# Patient Record
Sex: Male | Born: 1950
Health system: Southern US, Community
[De-identification: ages and names within clinical notes are randomized; demographics above are authoritative.]

## PROBLEM LIST (undated history)

## (undated) DIAGNOSIS — E7800 Pure hypercholesterolemia, unspecified: Secondary | ICD-10-CM

## (undated) DIAGNOSIS — M109 Gout, unspecified: Secondary | ICD-10-CM

---

## 2000-11-17 ENCOUNTER — Ambulatory Visit (HOSPITAL_COMMUNITY): Admission: RE | Admit: 2000-11-17 | Discharge: 2000-11-17 | Payer: Self-pay | Admitting: *Deleted

## 2000-11-17 ENCOUNTER — Encounter: Payer: Self-pay | Admitting: *Deleted

## 2002-12-24 ENCOUNTER — Encounter: Payer: Self-pay | Admitting: Family Medicine

## 2002-12-24 ENCOUNTER — Ambulatory Visit (HOSPITAL_COMMUNITY): Admission: RE | Admit: 2002-12-24 | Discharge: 2002-12-24 | Payer: Self-pay | Admitting: Family Medicine

## 2003-05-03 ENCOUNTER — Emergency Department (HOSPITAL_COMMUNITY): Admission: EM | Admit: 2003-05-03 | Discharge: 2003-05-03 | Payer: Self-pay | Admitting: Emergency Medicine

## 2003-09-09 ENCOUNTER — Ambulatory Visit (HOSPITAL_COMMUNITY): Admission: RE | Admit: 2003-09-09 | Discharge: 2003-09-09 | Payer: Self-pay | Admitting: Family Medicine

## 2003-09-16 ENCOUNTER — Ambulatory Visit (HOSPITAL_COMMUNITY): Admission: RE | Admit: 2003-09-16 | Discharge: 2003-09-16 | Payer: Self-pay | Admitting: Family Medicine

## 2004-02-07 ENCOUNTER — Ambulatory Visit (HOSPITAL_COMMUNITY): Admission: RE | Admit: 2004-02-07 | Discharge: 2004-02-07 | Payer: Self-pay | Admitting: Orthopaedic Surgery

## 2016-05-30 DIAGNOSIS — Z6826 Body mass index (BMI) 26.0-26.9, adult: Secondary | ICD-10-CM | POA: Diagnosis not present

## 2016-05-30 DIAGNOSIS — E063 Autoimmune thyroiditis: Secondary | ICD-10-CM | POA: Diagnosis not present

## 2016-05-30 DIAGNOSIS — E663 Overweight: Secondary | ICD-10-CM | POA: Diagnosis not present

## 2016-05-30 DIAGNOSIS — E782 Mixed hyperlipidemia: Secondary | ICD-10-CM | POA: Diagnosis not present

## 2016-05-30 DIAGNOSIS — Z1389 Encounter for screening for other disorder: Secondary | ICD-10-CM | POA: Diagnosis not present

## 2016-07-03 DIAGNOSIS — E782 Mixed hyperlipidemia: Secondary | ICD-10-CM | POA: Diagnosis not present

## 2016-07-03 DIAGNOSIS — Z1389 Encounter for screening for other disorder: Secondary | ICD-10-CM | POA: Diagnosis not present

## 2016-07-03 DIAGNOSIS — E063 Autoimmune thyroiditis: Secondary | ICD-10-CM | POA: Diagnosis not present

## 2016-07-03 DIAGNOSIS — Z6826 Body mass index (BMI) 26.0-26.9, adult: Secondary | ICD-10-CM | POA: Diagnosis not present

## 2017-01-03 DIAGNOSIS — E663 Overweight: Secondary | ICD-10-CM | POA: Diagnosis not present

## 2017-01-03 DIAGNOSIS — Z1389 Encounter for screening for other disorder: Secondary | ICD-10-CM | POA: Diagnosis not present

## 2017-01-03 DIAGNOSIS — E782 Mixed hyperlipidemia: Secondary | ICD-10-CM | POA: Diagnosis not present

## 2017-01-03 DIAGNOSIS — E063 Autoimmune thyroiditis: Secondary | ICD-10-CM | POA: Diagnosis not present

## 2017-01-03 DIAGNOSIS — Z6825 Body mass index (BMI) 25.0-25.9, adult: Secondary | ICD-10-CM | POA: Diagnosis not present

## 2017-03-05 DIAGNOSIS — E039 Hypothyroidism, unspecified: Secondary | ICD-10-CM | POA: Diagnosis not present

## 2017-03-05 DIAGNOSIS — R7309 Other abnormal glucose: Secondary | ICD-10-CM | POA: Diagnosis not present

## 2017-03-05 DIAGNOSIS — Z6827 Body mass index (BMI) 27.0-27.9, adult: Secondary | ICD-10-CM | POA: Diagnosis not present

## 2017-03-05 DIAGNOSIS — Z1389 Encounter for screening for other disorder: Secondary | ICD-10-CM | POA: Diagnosis not present

## 2017-10-16 DIAGNOSIS — Z6824 Body mass index (BMI) 24.0-24.9, adult: Secondary | ICD-10-CM | POA: Diagnosis not present

## 2017-10-16 DIAGNOSIS — M1000 Idiopathic gout, unspecified site: Secondary | ICD-10-CM | POA: Diagnosis not present

## 2018-03-20 DIAGNOSIS — E039 Hypothyroidism, unspecified: Secondary | ICD-10-CM | POA: Diagnosis not present

## 2018-03-20 DIAGNOSIS — Z6825 Body mass index (BMI) 25.0-25.9, adult: Secondary | ICD-10-CM | POA: Diagnosis not present

## 2018-03-20 DIAGNOSIS — E663 Overweight: Secondary | ICD-10-CM | POA: Diagnosis not present

## 2018-03-20 DIAGNOSIS — E063 Autoimmune thyroiditis: Secondary | ICD-10-CM | POA: Diagnosis not present

## 2018-03-20 DIAGNOSIS — E782 Mixed hyperlipidemia: Secondary | ICD-10-CM | POA: Diagnosis not present

## 2018-09-05 ENCOUNTER — Emergency Department (HOSPITAL_COMMUNITY)
Admission: EM | Admit: 2018-09-05 | Discharge: 2018-09-05 | Disposition: A | Discharge status: Home / Self Care | Payer: BLUE CROSS/BLUE SHIELD | Attending: Emergency Medicine | Admitting: Emergency Medicine

## 2018-09-05 ENCOUNTER — Other Ambulatory Visit: Payer: Self-pay

## 2018-09-05 ENCOUNTER — Encounter (HOSPITAL_COMMUNITY): Payer: Self-pay | Admitting: *Deleted

## 2018-09-05 DIAGNOSIS — M1A0710 Idiopathic chronic gout, right ankle and foot, without tophus (tophi): Secondary | ICD-10-CM | POA: Diagnosis not present

## 2018-09-05 DIAGNOSIS — M10071 Idiopathic gout, right ankle and foot: Secondary | ICD-10-CM | POA: Insufficient documentation

## 2018-09-05 DIAGNOSIS — M79671 Pain in right foot: Secondary | ICD-10-CM | POA: Diagnosis not present

## 2018-09-05 HISTORY — DX: Gout, unspecified: M10.9

## 2018-09-05 HISTORY — DX: Pure hypercholesterolemia, unspecified: E78.00

## 2018-09-05 MED ORDER — IBUPROFEN 800 MG PO TABS
800.0000 mg | ORAL_TABLET | Freq: Once | ORAL | Status: AC
  Administered 2018-09-05: 800 mg via ORAL
  Filled 2018-09-05: qty 1

## 2018-09-05 MED ORDER — OXYCODONE-ACETAMINOPHEN 5-325 MG PO TABS: 1.0000 | ORAL_TABLET | ORAL | 0 refills | Status: AC | PRN

## 2018-09-05 MED ORDER — PREDNISONE 50 MG PO TABS: 50.0000 mg | ORAL_TABLET | Freq: Every day | ORAL | 0 refills | Status: AC

## 2018-09-05 MED ORDER — PREDNISONE 50 MG PO TABS
60.0000 mg | ORAL_TABLET | Freq: Once | ORAL | Status: AC
  Administered 2018-09-05: 60 mg via ORAL
  Filled 2018-09-05: qty 1

## 2018-09-05 NOTE — Discharge Instructions (Addendum)
Take ibuprofen or naproxen for pain. Take acetaminophen for additional pain relief.  Take oxycodone-acetaminophen for severe pain. However, if you take this medicine, do not drive for four hours after taking it.

## 2018-09-05 NOTE — ED Triage Notes (Signed)
Pt c/o right foot pain that started earlier today; pt has a hx of gout; right foot has redness and swelling to top of foot as base of  toes

## 2018-09-05 NOTE — ED Provider Notes (Signed)
[facility] EMERGENCY DEPARTMENT Provider Note   CSN: [id number] Arrival date & time: 09/05/18  0019    History   Chief Complaint Chief Complaint  Patient presents with  . Foot Pain    HPI Keith Lane is a 68 y.o. male.   The history is provided by the patient.  Foot Pain   He has a history of gout and hyperlipidemia and comes in complaining of pain in his right foot typical of his gout.  Pain started today.  And is across the right midfoot.  He has taken some ibuprofen for pain without relief.  Past Medical History:  Diagnosis Date  . Gout   . Hypercholesteremia     There are no active problems to display for this patient.   History reviewed. No pertinent surgical history.      Home Medications    Prior to Admission medications   Not on File    Family History History reviewed. No pertinent family history.  Social History Social History   Tobacco Use  . Smoking status: Never Smoker  . Smokeless tobacco: Never Used  Substance Use Topics  . Alcohol use: Never    Frequency: Never  . Drug use: Never     Allergies   Patient has no known allergies.   Review of Systems Review of Systems  All other systems reviewed and are negative.    Physical Exam Updated Vital Signs BP (!) 160/88 (BP Location: Right Arm)   Pulse 72   Temp 99.4 F (37.4 C) (Oral)   Resp 18   Ht 5\' 10"  (1.778 m)   Wt 74.8 kg   SpO2 100%   BMI 23.68 kg/m   Physical Exam Vitals signs and nursing note reviewed.    68 year old male, resting comfortably and in no acute distress. Vital signs are significant for elevated systolic blood pressure. Oxygen saturation is 100%, which is normal. Head is normocephalic and atraumatic. PERRLA, EOMI. Oropharynx is clear. Neck is nontender and supple without adenopathy or JVD. Back is nontender and there is no CVA tenderness. Lungs are clear without rales, wheezes, or rhonchi. Chest is nontender. Heart has regular rate and  rhythm without murmur. Abdomen is soft, flat, nontender without masses or hepatosplenomegaly and peristalsis is normoactive. Extremities: There is mild erythema and warmth and swelling over the dorsum of the right midfoot consistent with early gout.  There is mild tenderness over the same area.  No lymphangitic streaks.  No break in the skin identified. Skin is warm and dry without rash. Neurologic: Mental status is normal, cranial nerves are intact, there are no motor or sensory deficits.  ED Treatments / Results   Procedures Procedures  Medications Ordered in ED Medications - No data to display   Initial Impression / Assessment and Plan / ED Course  I have reviewed the triage vital signs and the nursing notes.  Acute gout involving the right foot.  He is given a dose of prednisone and ibuprofen.  He is discharged with prescription for prednisone and given a take-home pack of oxycodone-acetaminophen.  He is planning to drive to [place] immediately upon leaving the ED, so he is advised that he may not drive if he has taken oxycodone within 4 hours.  Old records are reviewed, and he has no relevant past visits.  Final Clinical Impressions(s) / ED Diagnoses   Final diagnoses:  Acute idiopathic gout of right foot    ED Discharge Orders  Ordered    predniSONE (DELTASONE) 50 MG tablet  Daily     09/05/18 0049    oxyCODONE-acetaminophen (PERCOCET) 5-325 MG tablet  Every 4 hours PRN     09/05/18 0049           Dione Booze, MD 09/05/18 [phone]

## 2018-09-05 NOTE — ED Notes (Signed)
Patient was given prepackage of Percocet quantity six and given instructions on use.

## 2018-09-09 MED FILL — Oxycodone w/ Acetaminophen Tab 5-325 MG: ORAL | Qty: 6 | Status: AC

## 2018-11-16 DIAGNOSIS — R42 Dizziness and giddiness: Secondary | ICD-10-CM | POA: Diagnosis not present

## 2018-11-16 DIAGNOSIS — E063 Autoimmune thyroiditis: Secondary | ICD-10-CM | POA: Diagnosis not present

## 2018-11-16 DIAGNOSIS — Z6827 Body mass index (BMI) 27.0-27.9, adult: Secondary | ICD-10-CM | POA: Diagnosis not present

## 2018-11-16 DIAGNOSIS — R5383 Other fatigue: Secondary | ICD-10-CM | POA: Diagnosis not present

## 2018-11-16 DIAGNOSIS — R945 Abnormal results of liver function studies: Secondary | ICD-10-CM | POA: Diagnosis not present

## 2018-11-16 DIAGNOSIS — Z1389 Encounter for screening for other disorder: Secondary | ICD-10-CM | POA: Diagnosis not present

## 2018-11-18 ENCOUNTER — Other Ambulatory Visit: Payer: Self-pay | Admitting: Internal Medicine

## 2018-11-18 ENCOUNTER — Other Ambulatory Visit (HOSPITAL_COMMUNITY): Payer: Self-pay | Admitting: Internal Medicine

## 2018-11-18 DIAGNOSIS — R945 Abnormal results of liver function studies: Secondary | ICD-10-CM

## 2018-11-19 DIAGNOSIS — R7989 Other specified abnormal findings of blood chemistry: Secondary | ICD-10-CM | POA: Diagnosis not present

## 2018-11-24 ENCOUNTER — Ambulatory Visit (HOSPITAL_COMMUNITY)
Admission: RE | Admit: 2018-11-24 | Discharge: 2018-11-24 | Disposition: A | Discharge status: Home / Self Care | Payer: BLUE CROSS/BLUE SHIELD | Source: Ambulatory Visit | Attending: Internal Medicine | Admitting: Internal Medicine

## 2018-11-24 ENCOUNTER — Other Ambulatory Visit: Payer: Self-pay

## 2018-11-24 DIAGNOSIS — R7989 Other specified abnormal findings of blood chemistry: Secondary | ICD-10-CM | POA: Diagnosis not present

## 2018-11-24 DIAGNOSIS — R945 Abnormal results of liver function studies: Secondary | ICD-10-CM | POA: Diagnosis not present

## 2018-11-25 DIAGNOSIS — M10071 Idiopathic gout, right ankle and foot: Secondary | ICD-10-CM | POA: Diagnosis not present

## 2018-12-02 DIAGNOSIS — R5383 Other fatigue: Secondary | ICD-10-CM | POA: Diagnosis not present

## 2018-12-02 DIAGNOSIS — E063 Autoimmune thyroiditis: Secondary | ICD-10-CM | POA: Diagnosis not present

## 2018-12-02 DIAGNOSIS — Z1389 Encounter for screening for other disorder: Secondary | ICD-10-CM | POA: Diagnosis not present

## 2018-12-02 DIAGNOSIS — E291 Testicular hypofunction: Secondary | ICD-10-CM | POA: Diagnosis not present

## 2018-12-02 DIAGNOSIS — Z6827 Body mass index (BMI) 27.0-27.9, adult: Secondary | ICD-10-CM | POA: Diagnosis not present

## 2018-12-10 ENCOUNTER — Encounter: Payer: Self-pay | Admitting: Gastroenterology

## 2018-12-18 DIAGNOSIS — R7989 Other specified abnormal findings of blood chemistry: Secondary | ICD-10-CM | POA: Diagnosis not present

## 2018-12-18 DIAGNOSIS — E291 Testicular hypofunction: Secondary | ICD-10-CM | POA: Diagnosis not present

## 2018-12-21 DIAGNOSIS — Z1389 Encounter for screening for other disorder: Secondary | ICD-10-CM | POA: Diagnosis not present

## 2018-12-21 DIAGNOSIS — E063 Autoimmune thyroiditis: Secondary | ICD-10-CM | POA: Diagnosis not present

## 2018-12-21 DIAGNOSIS — E039 Hypothyroidism, unspecified: Secondary | ICD-10-CM | POA: Diagnosis not present

## 2018-12-21 DIAGNOSIS — Z6826 Body mass index (BMI) 26.0-26.9, adult: Secondary | ICD-10-CM | POA: Diagnosis not present

## 2018-12-21 DIAGNOSIS — E291 Testicular hypofunction: Secondary | ICD-10-CM | POA: Diagnosis not present

## 2018-12-21 DIAGNOSIS — M109 Gout, unspecified: Secondary | ICD-10-CM | POA: Diagnosis not present

## 2019-01-18 DIAGNOSIS — E663 Overweight: Secondary | ICD-10-CM | POA: Diagnosis not present

## 2019-01-18 DIAGNOSIS — Z6826 Body mass index (BMI) 26.0-26.9, adult: Secondary | ICD-10-CM | POA: Diagnosis not present

## 2019-01-18 DIAGNOSIS — Z1389 Encounter for screening for other disorder: Secondary | ICD-10-CM | POA: Diagnosis not present

## 2019-01-18 DIAGNOSIS — M109 Gout, unspecified: Secondary | ICD-10-CM | POA: Diagnosis not present

## 2019-01-25 DIAGNOSIS — R7989 Other specified abnormal findings of blood chemistry: Secondary | ICD-10-CM | POA: Diagnosis not present

## 2019-01-28 ENCOUNTER — Ambulatory Visit: Payer: BLUE CROSS/BLUE SHIELD | Admitting: Gastroenterology

## 2019-01-28 ENCOUNTER — Telehealth: Payer: Self-pay | Admitting: Gastroenterology

## 2019-01-28 ENCOUNTER — Encounter: Payer: Self-pay | Admitting: Gastroenterology

## 2019-01-28 NOTE — Telephone Encounter (Signed)
PATENT WAS A NO SHOW AND LETTER SENT  °

## 2019-01-28 NOTE — Telephone Encounter (Signed)
REVIEWED-NO ADDITIONAL RECOMMENDATIONS. 

## 2019-02-26 DIAGNOSIS — R7989 Other specified abnormal findings of blood chemistry: Secondary | ICD-10-CM | POA: Diagnosis not present

## 2019-03-29 DIAGNOSIS — E6609 Other obesity due to excess calories: Secondary | ICD-10-CM | POA: Diagnosis not present

## 2019-03-29 DIAGNOSIS — E039 Hypothyroidism, unspecified: Secondary | ICD-10-CM | POA: Diagnosis not present

## 2019-03-29 DIAGNOSIS — R7309 Other abnormal glucose: Secondary | ICD-10-CM | POA: Diagnosis not present

## 2019-03-29 DIAGNOSIS — E291 Testicular hypofunction: Secondary | ICD-10-CM | POA: Diagnosis not present

## 2019-03-29 DIAGNOSIS — E7849 Other hyperlipidemia: Secondary | ICD-10-CM | POA: Diagnosis not present

## 2019-03-29 DIAGNOSIS — Z6832 Body mass index (BMI) 32.0-32.9, adult: Secondary | ICD-10-CM | POA: Diagnosis not present

## 2019-04-28 DIAGNOSIS — E291 Testicular hypofunction: Secondary | ICD-10-CM | POA: Diagnosis not present

## 2019-04-28 DIAGNOSIS — Z6832 Body mass index (BMI) 32.0-32.9, adult: Secondary | ICD-10-CM | POA: Diagnosis not present

## 2019-04-28 DIAGNOSIS — E6609 Other obesity due to excess calories: Secondary | ICD-10-CM | POA: Diagnosis not present

## 2019-04-28 DIAGNOSIS — M109 Gout, unspecified: Secondary | ICD-10-CM | POA: Diagnosis not present

## 2019-05-31 DIAGNOSIS — R7989 Other specified abnormal findings of blood chemistry: Secondary | ICD-10-CM | POA: Diagnosis not present

## 2019-06-22 DIAGNOSIS — Z6826 Body mass index (BMI) 26.0-26.9, adult: Secondary | ICD-10-CM | POA: Diagnosis not present

## 2019-06-22 DIAGNOSIS — Z0000 Encounter for general adult medical examination without abnormal findings: Secondary | ICD-10-CM | POA: Diagnosis not present

## 2019-06-22 DIAGNOSIS — E663 Overweight: Secondary | ICD-10-CM | POA: Diagnosis not present

## 2019-06-22 DIAGNOSIS — E063 Autoimmune thyroiditis: Secondary | ICD-10-CM | POA: Diagnosis not present

## 2019-06-22 DIAGNOSIS — E7849 Other hyperlipidemia: Secondary | ICD-10-CM | POA: Diagnosis not present

## 2019-06-29 DIAGNOSIS — R7989 Other specified abnormal findings of blood chemistry: Secondary | ICD-10-CM | POA: Diagnosis not present

## 2019-07-30 DIAGNOSIS — R7989 Other specified abnormal findings of blood chemistry: Secondary | ICD-10-CM | POA: Diagnosis not present

## 2019-08-30 DIAGNOSIS — R7989 Other specified abnormal findings of blood chemistry: Secondary | ICD-10-CM | POA: Diagnosis not present

## 2019-10-04 DIAGNOSIS — R7989 Other specified abnormal findings of blood chemistry: Secondary | ICD-10-CM | POA: Diagnosis not present

## 2019-10-12 DIAGNOSIS — E663 Overweight: Secondary | ICD-10-CM | POA: Diagnosis not present

## 2019-10-12 DIAGNOSIS — Z6826 Body mass index (BMI) 26.0-26.9, adult: Secondary | ICD-10-CM | POA: Diagnosis not present

## 2019-10-12 DIAGNOSIS — E039 Hypothyroidism, unspecified: Secondary | ICD-10-CM | POA: Diagnosis not present

## 2019-10-12 DIAGNOSIS — Z1389 Encounter for screening for other disorder: Secondary | ICD-10-CM | POA: Diagnosis not present

## 2019-11-03 DIAGNOSIS — R7989 Other specified abnormal findings of blood chemistry: Secondary | ICD-10-CM | POA: Diagnosis not present

## 2019-11-23 DIAGNOSIS — E7849 Other hyperlipidemia: Secondary | ICD-10-CM | POA: Diagnosis not present

## 2019-11-23 DIAGNOSIS — E781 Pure hyperglyceridemia: Secondary | ICD-10-CM | POA: Diagnosis not present

## 2019-11-23 DIAGNOSIS — E039 Hypothyroidism, unspecified: Secondary | ICD-10-CM | POA: Diagnosis not present

## 2019-11-23 DIAGNOSIS — E663 Overweight: Secondary | ICD-10-CM | POA: Diagnosis not present

## 2019-11-23 DIAGNOSIS — J302 Other seasonal allergic rhinitis: Secondary | ICD-10-CM | POA: Diagnosis not present

## 2019-11-23 DIAGNOSIS — Z6826 Body mass index (BMI) 26.0-26.9, adult: Secondary | ICD-10-CM | POA: Diagnosis not present

## 2020-01-13 DIAGNOSIS — R7989 Other specified abnormal findings of blood chemistry: Secondary | ICD-10-CM | POA: Diagnosis not present

## 2020-02-08 DIAGNOSIS — R7989 Other specified abnormal findings of blood chemistry: Secondary | ICD-10-CM | POA: Diagnosis not present

## 2020-02-29 DIAGNOSIS — Z6827 Body mass index (BMI) 27.0-27.9, adult: Secondary | ICD-10-CM | POA: Diagnosis not present

## 2020-02-29 DIAGNOSIS — N182 Chronic kidney disease, stage 2 (mild): Secondary | ICD-10-CM | POA: Diagnosis not present

## 2020-02-29 DIAGNOSIS — J302 Other seasonal allergic rhinitis: Secondary | ICD-10-CM | POA: Diagnosis not present

## 2020-02-29 DIAGNOSIS — R42 Dizziness and giddiness: Secondary | ICD-10-CM | POA: Diagnosis not present

## 2020-02-29 DIAGNOSIS — E039 Hypothyroidism, unspecified: Secondary | ICD-10-CM | POA: Diagnosis not present

## 2020-02-29 DIAGNOSIS — R7309 Other abnormal glucose: Secondary | ICD-10-CM | POA: Diagnosis not present

## 2020-03-10 DIAGNOSIS — R7989 Other specified abnormal findings of blood chemistry: Secondary | ICD-10-CM | POA: Diagnosis not present

## 2020-04-11 DIAGNOSIS — R945 Abnormal results of liver function studies: Secondary | ICD-10-CM | POA: Diagnosis not present

## 2020-04-11 DIAGNOSIS — E063 Autoimmune thyroiditis: Secondary | ICD-10-CM | POA: Diagnosis not present

## 2020-04-11 DIAGNOSIS — R42 Dizziness and giddiness: Secondary | ICD-10-CM | POA: Diagnosis not present

## 2020-04-11 DIAGNOSIS — E291 Testicular hypofunction: Secondary | ICD-10-CM | POA: Diagnosis not present

## 2020-04-11 DIAGNOSIS — Z6826 Body mass index (BMI) 26.0-26.9, adult: Secondary | ICD-10-CM | POA: Diagnosis not present

## 2020-04-11 DIAGNOSIS — E039 Hypothyroidism, unspecified: Secondary | ICD-10-CM | POA: Diagnosis not present

## 2020-04-11 DIAGNOSIS — R519 Headache, unspecified: Secondary | ICD-10-CM | POA: Diagnosis not present

## 2020-04-25 ENCOUNTER — Other Ambulatory Visit (HOSPITAL_COMMUNITY): Payer: Self-pay | Admitting: Internal Medicine

## 2020-04-25 DIAGNOSIS — R945 Abnormal results of liver function studies: Secondary | ICD-10-CM

## 2020-04-27 ENCOUNTER — Other Ambulatory Visit: Payer: Self-pay | Admitting: Internal Medicine

## 2020-04-27 ENCOUNTER — Other Ambulatory Visit (HOSPITAL_COMMUNITY): Payer: Self-pay | Admitting: Internal Medicine

## 2020-04-27 DIAGNOSIS — R519 Headache, unspecified: Secondary | ICD-10-CM

## 2020-04-27 DIAGNOSIS — R42 Dizziness and giddiness: Secondary | ICD-10-CM

## 2020-05-04 ENCOUNTER — Ambulatory Visit (HOSPITAL_COMMUNITY)
Admission: RE | Admit: 2020-05-04 | Discharge: 2020-05-04 | Disposition: A | Discharge status: Home / Self Care | Payer: BC Managed Care – PPO | Source: Ambulatory Visit | Attending: Internal Medicine | Admitting: Internal Medicine

## 2020-05-04 ENCOUNTER — Other Ambulatory Visit: Payer: Self-pay

## 2020-05-04 DIAGNOSIS — R42 Dizziness and giddiness: Secondary | ICD-10-CM | POA: Insufficient documentation

## 2020-05-04 DIAGNOSIS — N281 Cyst of kidney, acquired: Secondary | ICD-10-CM | POA: Diagnosis not present

## 2020-05-04 DIAGNOSIS — R519 Headache, unspecified: Secondary | ICD-10-CM | POA: Insufficient documentation

## 2020-05-04 DIAGNOSIS — R945 Abnormal results of liver function studies: Secondary | ICD-10-CM | POA: Insufficient documentation

## 2020-05-04 DIAGNOSIS — K7689 Other specified diseases of liver: Secondary | ICD-10-CM | POA: Diagnosis not present

## 2020-05-15 DIAGNOSIS — R7989 Other specified abnormal findings of blood chemistry: Secondary | ICD-10-CM | POA: Diagnosis not present

## 2020-06-12 DIAGNOSIS — R7989 Other specified abnormal findings of blood chemistry: Secondary | ICD-10-CM | POA: Diagnosis not present

## 2020-06-14 ENCOUNTER — Encounter (INDEPENDENT_AMBULATORY_CARE_PROVIDER_SITE_OTHER): Payer: Self-pay | Admitting: Gastroenterology

## 2020-06-21 DIAGNOSIS — R945 Abnormal results of liver function studies: Secondary | ICD-10-CM | POA: Diagnosis not present

## 2020-06-21 DIAGNOSIS — E663 Overweight: Secondary | ICD-10-CM | POA: Diagnosis not present

## 2020-06-21 DIAGNOSIS — Z6826 Body mass index (BMI) 26.0-26.9, adult: Secondary | ICD-10-CM | POA: Diagnosis not present

## 2020-06-21 DIAGNOSIS — E039 Hypothyroidism, unspecified: Secondary | ICD-10-CM | POA: Diagnosis not present

## 2020-07-13 DIAGNOSIS — R7989 Other specified abnormal findings of blood chemistry: Secondary | ICD-10-CM | POA: Diagnosis not present

## 2020-08-03 DIAGNOSIS — E039 Hypothyroidism, unspecified: Secondary | ICD-10-CM | POA: Diagnosis not present

## 2020-08-15 DIAGNOSIS — R7989 Other specified abnormal findings of blood chemistry: Secondary | ICD-10-CM | POA: Diagnosis not present

## 2020-09-14 DIAGNOSIS — R7989 Other specified abnormal findings of blood chemistry: Secondary | ICD-10-CM | POA: Diagnosis not present

## 2020-09-18 ENCOUNTER — Ambulatory Visit (INDEPENDENT_AMBULATORY_CARE_PROVIDER_SITE_OTHER): Payer: BC Managed Care – PPO | Admitting: Gastroenterology

## 2020-10-18 DIAGNOSIS — R7989 Other specified abnormal findings of blood chemistry: Secondary | ICD-10-CM | POA: Diagnosis not present

## 2020-10-18 DIAGNOSIS — E291 Testicular hypofunction: Secondary | ICD-10-CM | POA: Diagnosis not present

## 2020-11-23 DIAGNOSIS — R7989 Other specified abnormal findings of blood chemistry: Secondary | ICD-10-CM | POA: Diagnosis not present

## 2020-12-18 DIAGNOSIS — Z1389 Encounter for screening for other disorder: Secondary | ICD-10-CM | POA: Diagnosis not present

## 2020-12-18 DIAGNOSIS — E039 Hypothyroidism, unspecified: Secondary | ICD-10-CM | POA: Diagnosis not present

## 2020-12-29 DIAGNOSIS — R7989 Other specified abnormal findings of blood chemistry: Secondary | ICD-10-CM | POA: Diagnosis not present

## 2021-01-30 DIAGNOSIS — E291 Testicular hypofunction: Secondary | ICD-10-CM | POA: Diagnosis not present

## 2021-01-30 DIAGNOSIS — R7989 Other specified abnormal findings of blood chemistry: Secondary | ICD-10-CM | POA: Diagnosis not present

## 2021-03-02 DIAGNOSIS — R7989 Other specified abnormal findings of blood chemistry: Secondary | ICD-10-CM | POA: Diagnosis not present

## 2021-03-27 DIAGNOSIS — E039 Hypothyroidism, unspecified: Secondary | ICD-10-CM | POA: Diagnosis not present

## 2021-03-27 DIAGNOSIS — Z0001 Encounter for general adult medical examination with abnormal findings: Secondary | ICD-10-CM | POA: Diagnosis not present

## 2021-03-27 DIAGNOSIS — E782 Mixed hyperlipidemia: Secondary | ICD-10-CM | POA: Diagnosis not present

## 2021-03-27 DIAGNOSIS — I10 Essential (primary) hypertension: Secondary | ICD-10-CM | POA: Diagnosis not present

## 2021-03-28 ENCOUNTER — Other Ambulatory Visit: Payer: Self-pay | Admitting: Internal Medicine

## 2021-03-28 ENCOUNTER — Other Ambulatory Visit (HOSPITAL_COMMUNITY): Payer: Self-pay | Admitting: Internal Medicine

## 2021-03-28 DIAGNOSIS — R748 Abnormal levels of other serum enzymes: Secondary | ICD-10-CM

## 2021-03-29 IMAGING — CT CT HEAD W/O CM
3 series · 16 of 47 positions shown, 19 images · non-contrast
Comparison: None.

CLINICAL DATA: Chronic dizziness.

EXAM:
CT HEAD WITHOUT CONTRAST
TECHNIQUE: Contiguous axial images were obtained from the base of the skull
through the vertex without intravenous contrast.

[Series 2: head w o · axial · 0.42mm/px · z∈[-23,+107]mm · 10 of 32 slices shown, 13 images]
[im 3/32  brain]
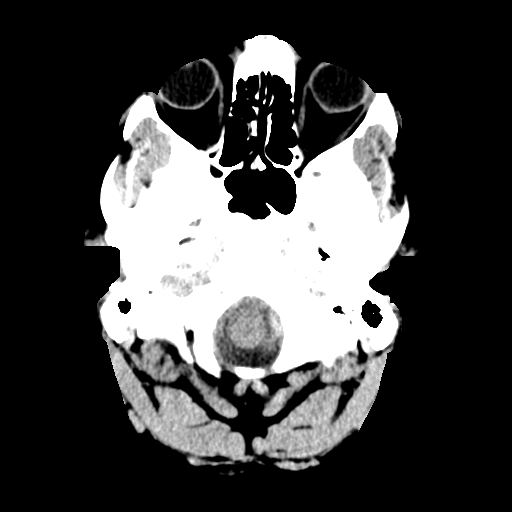
[im 3/32  bone]
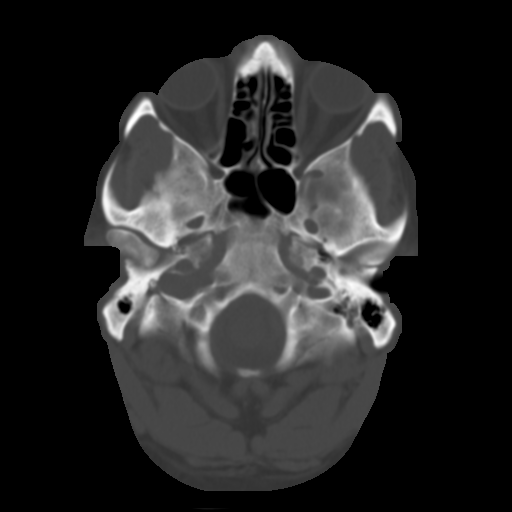
[im 6/32  brain]
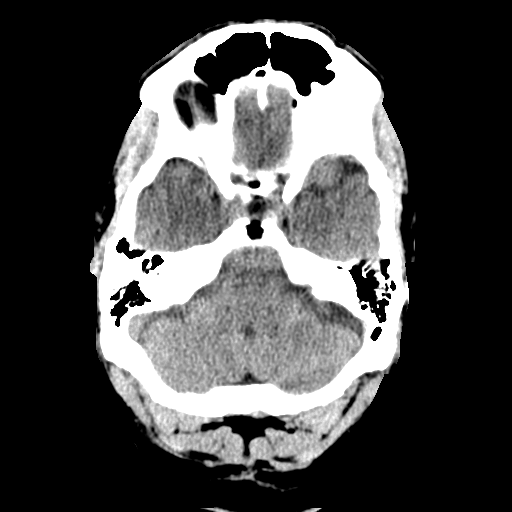
[im 9/32  brain]
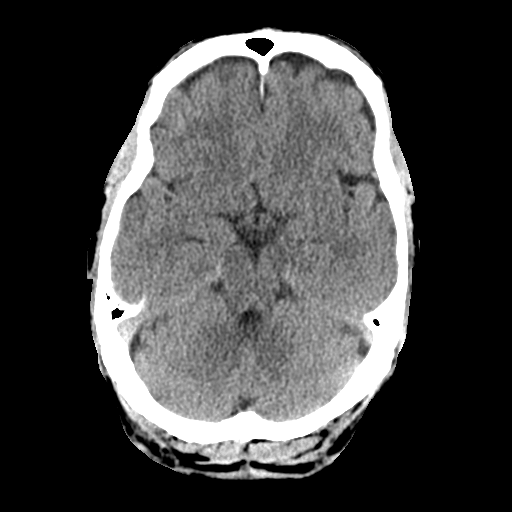
[im 11/32  brain]
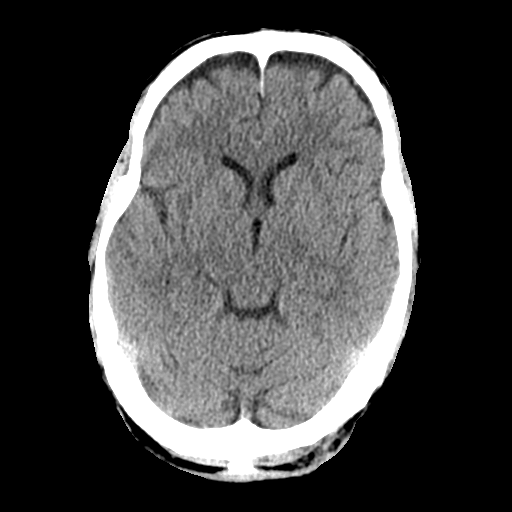
[im 14/32  brain]
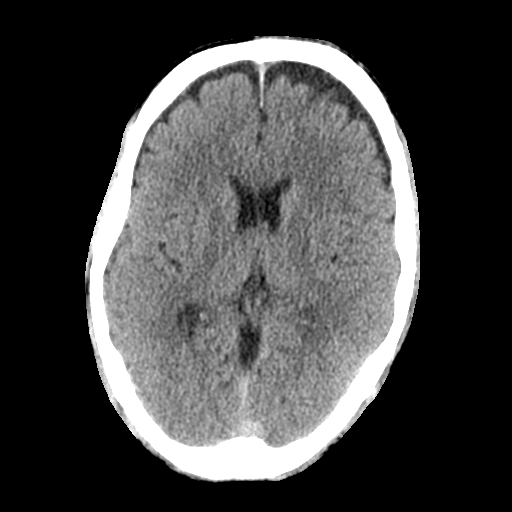
[im 14/32  bone]
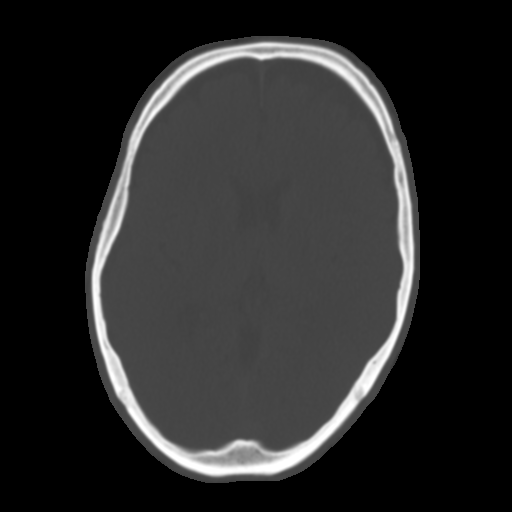
[im 18/32  brain]
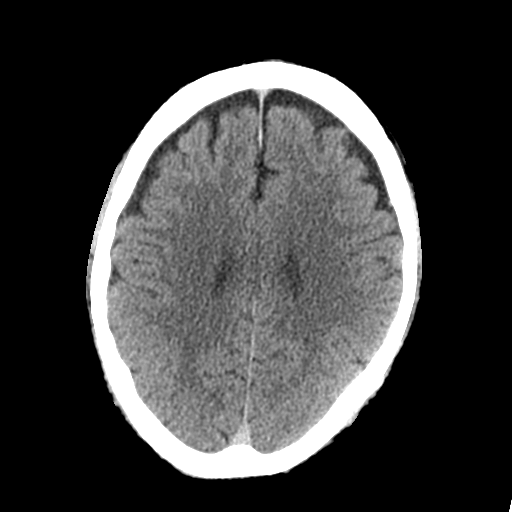
[im 21/32  brain]
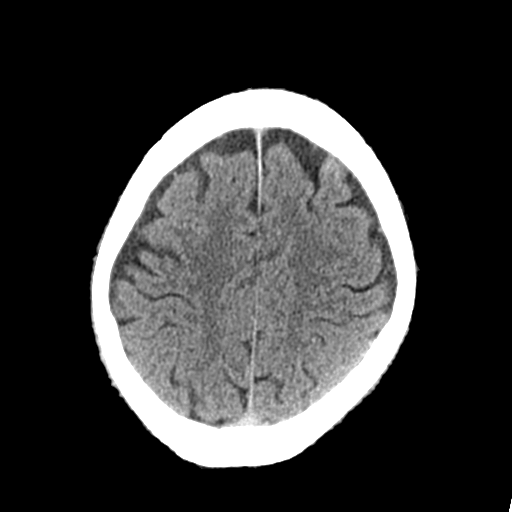
[im 24/32  brain]
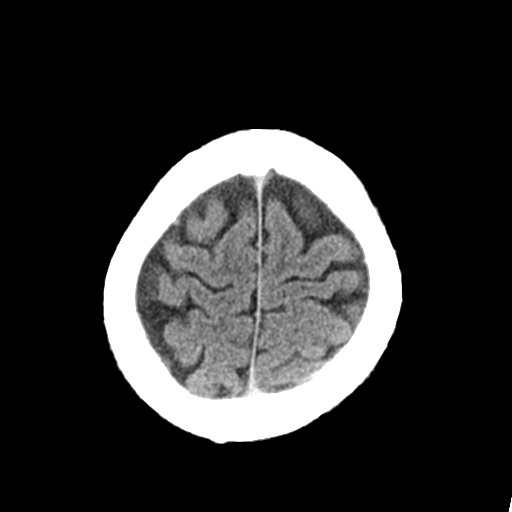
[im 26/32  brain]
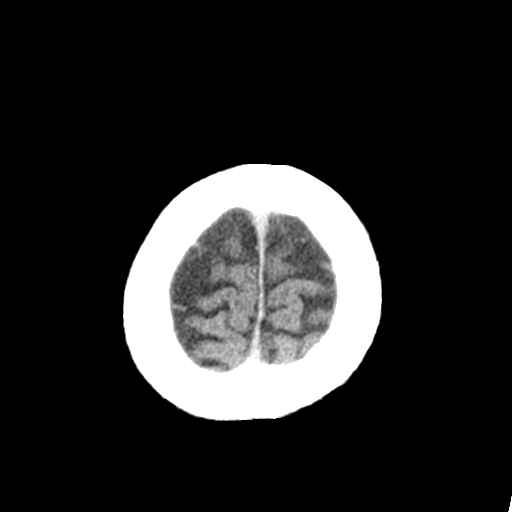
[im 26/32  bone]
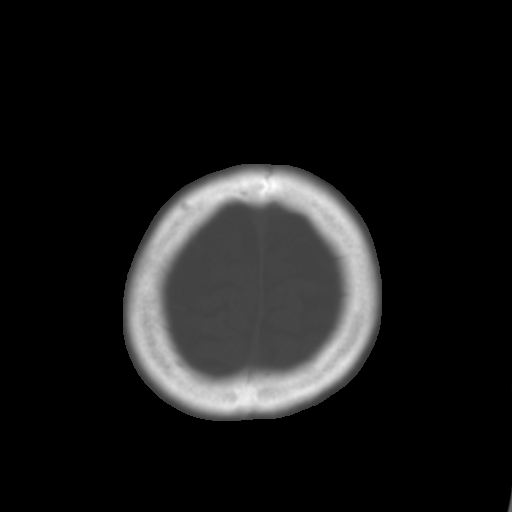
[im 29/32  brain]
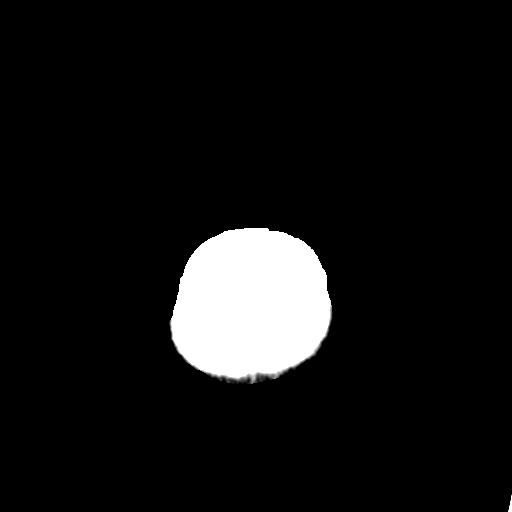

[Series 4: coronal soft · coronal · 0.31mm/px · 3 of 68 slices shown]
[im 23/68  brain]
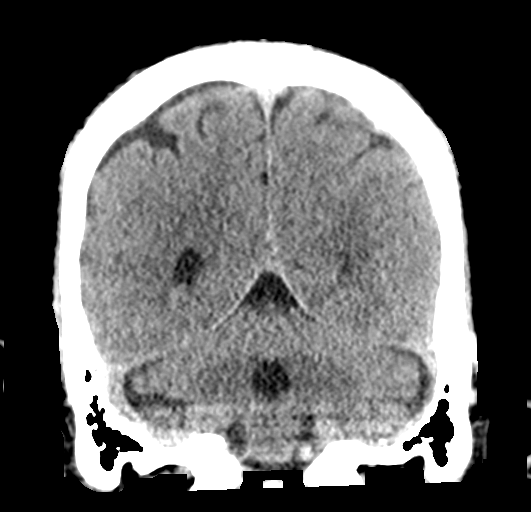
[im 30/68  brain]
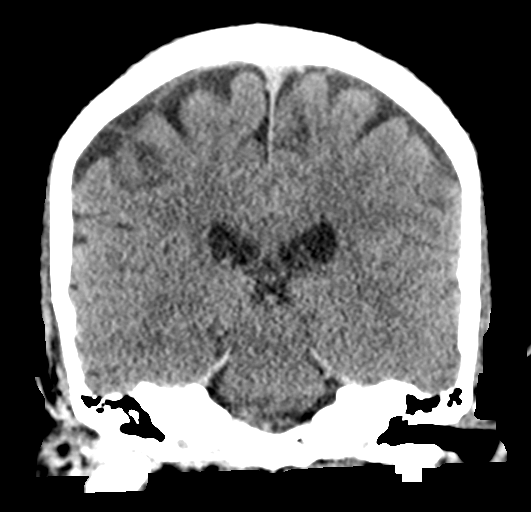
[im 38/68  brain]
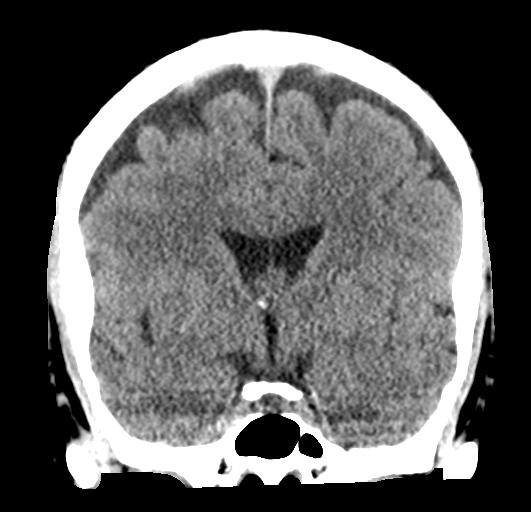

[Series 5: sagittal soft · sagittal · 0.35mm/px · 3 of 56 slices shown]
[im 19/56  brain]
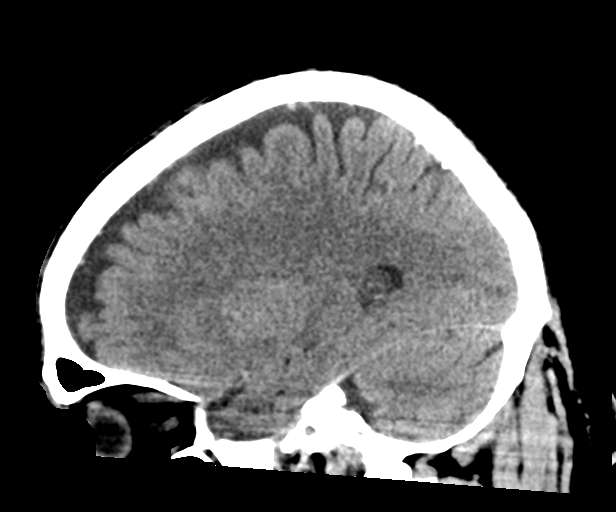
[im 28/56  brain]
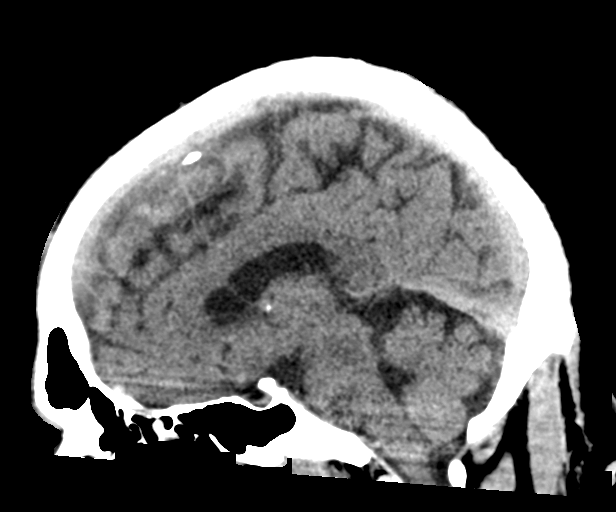
[im 37/56  brain]
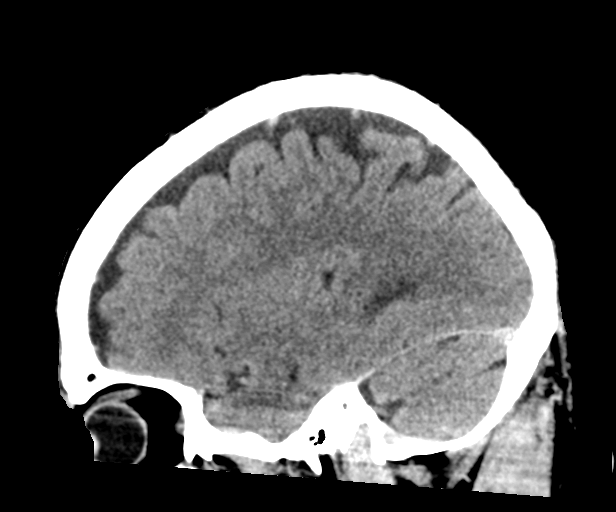

[16 of 47 positions shown; findings below may reference images not displayed]

FINDINGS: Brain: No evidence of acute large vascular territory infarction,
hemorrhage, hydrocephalus, extra-axial collection or mass
lesion/mass effect. Mild generalized atrophy with mildly prominent
frontal extra-axial spaces.

Vascular: No hyperdense vessel or unexpected calcification.

Skull: Normal. Negative for fracture or focal lesion.

Sinuses/Orbits: No acute finding.

Other: No mastoid effusions.
IMPRESSION: No evidence of acute intracranial abnormality.

## 2021-04-04 ENCOUNTER — Ambulatory Visit (HOSPITAL_COMMUNITY)
Admission: RE | Admit: 2021-04-04 | Discharge: 2021-04-04 | Disposition: A | Discharge status: Home / Self Care | Payer: BC Managed Care – PPO | Source: Ambulatory Visit | Attending: Internal Medicine | Admitting: Internal Medicine

## 2021-04-04 ENCOUNTER — Other Ambulatory Visit: Payer: Self-pay

## 2021-04-04 DIAGNOSIS — K760 Fatty (change of) liver, not elsewhere classified: Secondary | ICD-10-CM | POA: Diagnosis not present

## 2021-04-04 DIAGNOSIS — R748 Abnormal levels of other serum enzymes: Secondary | ICD-10-CM | POA: Diagnosis not present

## 2021-04-04 DIAGNOSIS — E291 Testicular hypofunction: Secondary | ICD-10-CM | POA: Diagnosis not present

## 2021-05-04 DIAGNOSIS — E291 Testicular hypofunction: Secondary | ICD-10-CM | POA: Diagnosis not present

## 2021-06-07 DIAGNOSIS — E291 Testicular hypofunction: Secondary | ICD-10-CM | POA: Diagnosis not present

## 2021-07-20 DIAGNOSIS — E291 Testicular hypofunction: Secondary | ICD-10-CM | POA: Diagnosis not present

## 2021-08-20 DIAGNOSIS — E291 Testicular hypofunction: Secondary | ICD-10-CM | POA: Diagnosis not present

## 2021-09-17 ENCOUNTER — Emergency Department (HOSPITAL_COMMUNITY)
Admission: EM | Admit: 2021-09-17 | Discharge: 2021-09-17 | Discharge status: Absent without leave | Payer: BC Managed Care – PPO | Source: Home / Self Care

## 2021-09-20 DIAGNOSIS — E291 Testicular hypofunction: Secondary | ICD-10-CM | POA: Diagnosis not present

## 2021-10-22 DIAGNOSIS — E291 Testicular hypofunction: Secondary | ICD-10-CM | POA: Diagnosis not present

## 2021-11-27 DIAGNOSIS — E291 Testicular hypofunction: Secondary | ICD-10-CM | POA: Diagnosis not present

## 2021-12-27 DIAGNOSIS — E291 Testicular hypofunction: Secondary | ICD-10-CM | POA: Diagnosis not present

## 2022-01-30 DIAGNOSIS — E291 Testicular hypofunction: Secondary | ICD-10-CM | POA: Diagnosis not present

## 2022-02-25 DIAGNOSIS — E291 Testicular hypofunction: Secondary | ICD-10-CM | POA: Diagnosis not present

## 2022-02-27 IMAGING — US US ABDOMEN COMPLETE
1 series · 14 of 25 positions shown · non-contrast
Comparison: Abdominal ultrasound dated May 04, 2020.

CLINICAL DATA: Abnormal LFTs.

EXAM:
ABDOMEN ULTRASOUND COMPLETE

[Series 1: us abdomen complete · 0.18mm/px · 14 of 132 slices shown]
[im 1/132]
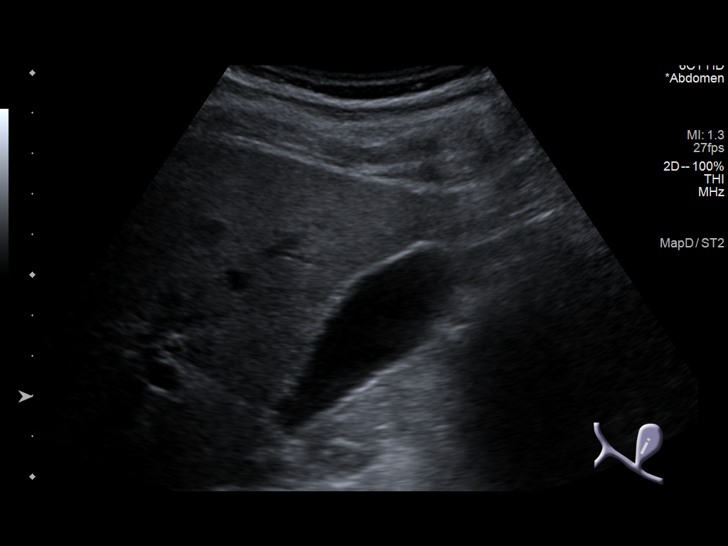
[im 11/132]
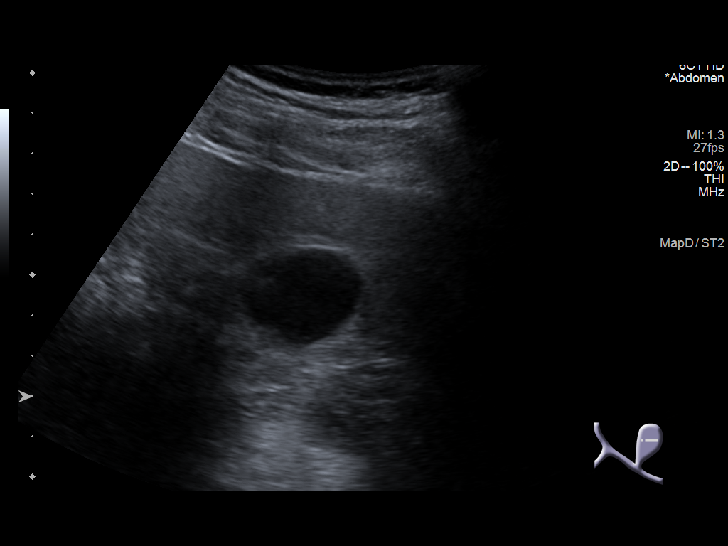
[im 22/132]
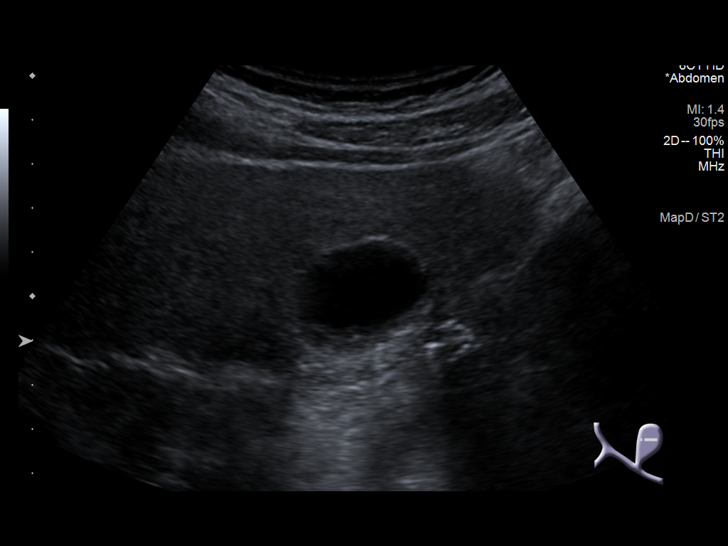
[im 33/132]
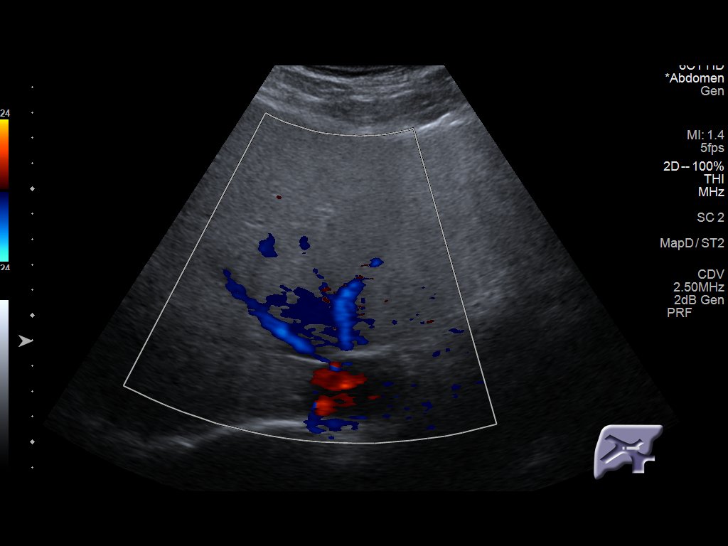
[im 44/132]
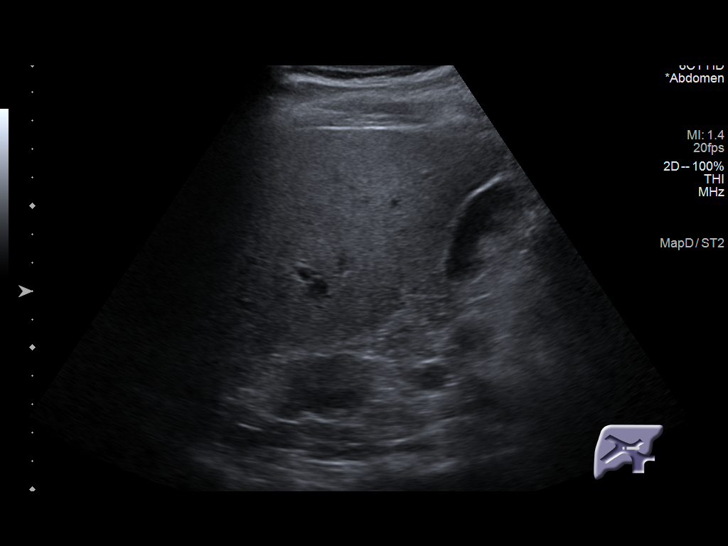
[im 50/132]
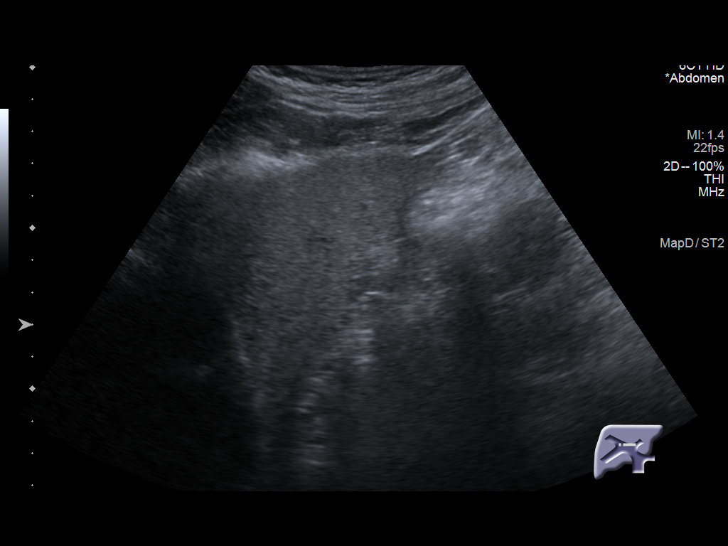
[im 61/132]
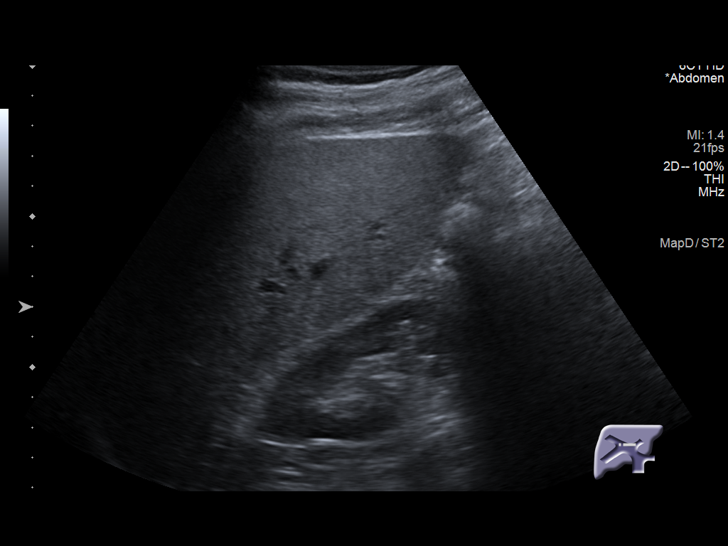
[im 71/132]
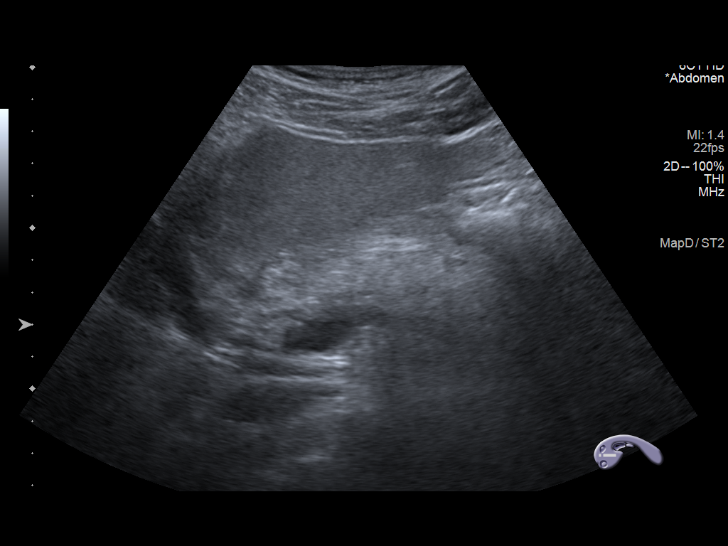
[im 82/132]
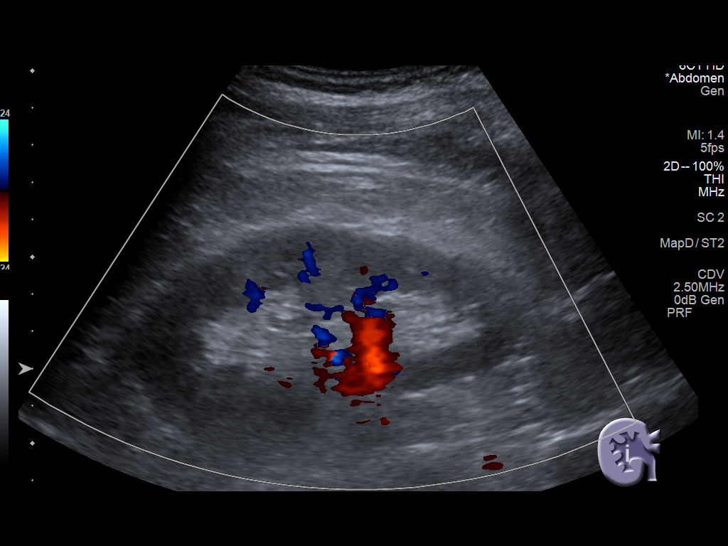
[im 88/132]
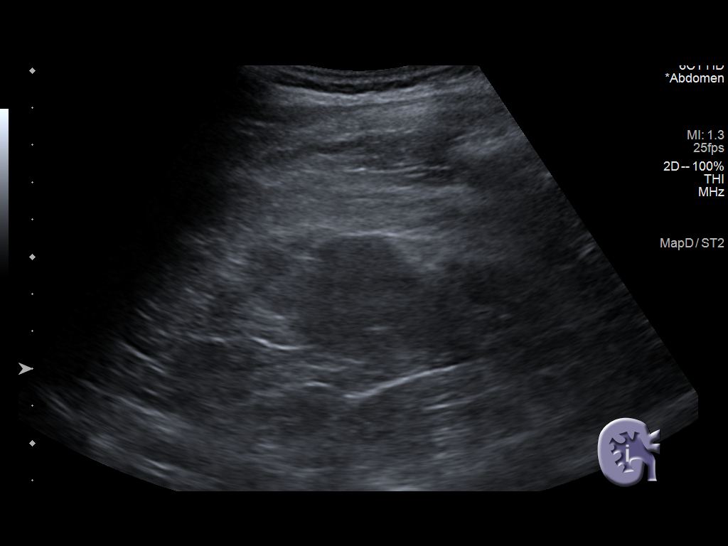
[im 99/132]
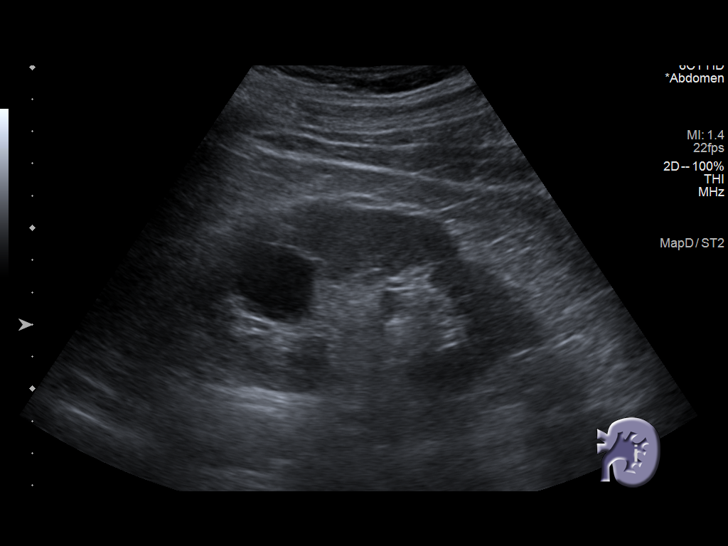
[im 110/132]
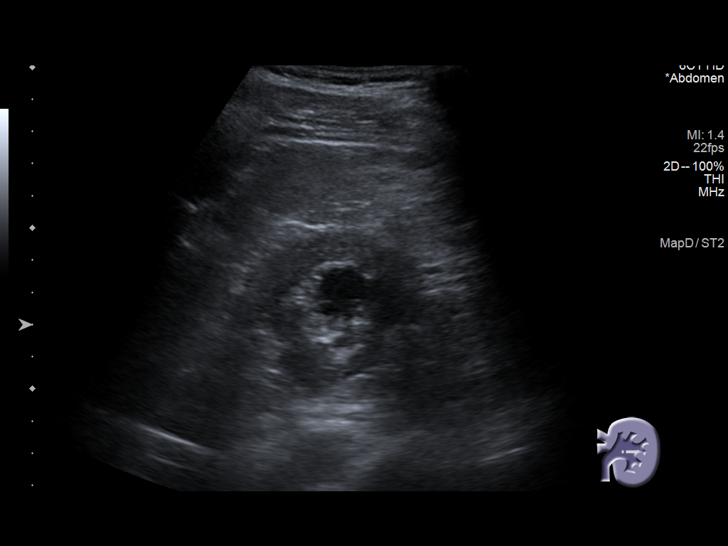
[im 121/132]
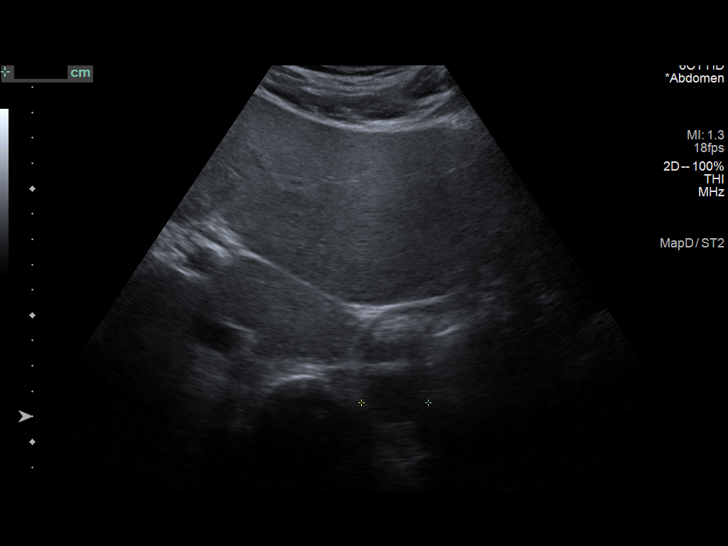
[im 132/132]
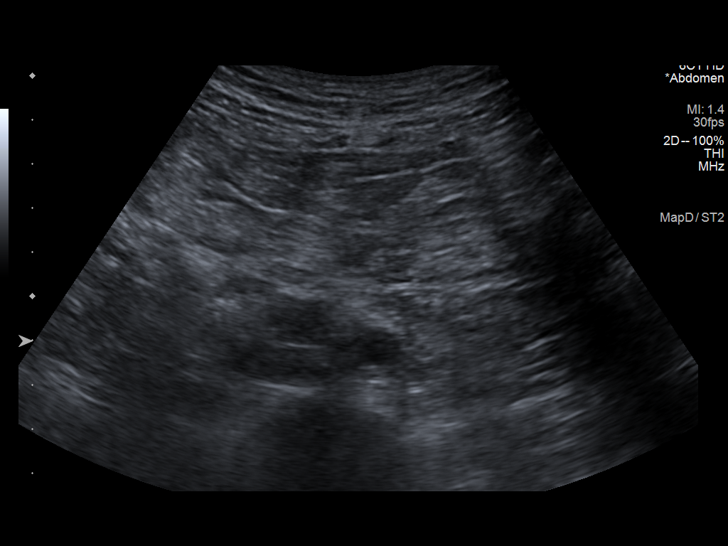

[14 of 25 positions shown; findings below may reference images not displayed]

FINDINGS: Gallbladder: No gallstones or wall thickening visualized. No
sonographic Murphy sign noted by sonographer.

Common bile duct: Diameter: 4 mm, normal.

Liver: No focal lesion identified. Unchanged diffusely increased
parenchymal echogenicity. Portal vein is patent on color Doppler
imaging with normal direction of blood flow towards the liver.

IVC: No abnormality visualized.

Pancreas: Visualized portion unremarkable.

Spleen: Size and appearance within normal limits.

Right Kidney: Length: 10.5 cm. Echogenicity within normal limits. No
mass or hydronephrosis visualized.

Left Kidney: Length: 11.0 cm. Echogenicity within normal limits. No
mass or hydronephrosis visualized. Unchanged 2.7 cm simple cyst.

Abdominal aorta: No aneurysm visualized.

Other findings: None.
IMPRESSION: 1. Unchanged hepatic steatosis.

## 2022-04-24 DIAGNOSIS — E119 Type 2 diabetes mellitus without complications: Secondary | ICD-10-CM | POA: Diagnosis not present

## 2022-04-24 DIAGNOSIS — Z6827 Body mass index (BMI) 27.0-27.9, adult: Secondary | ICD-10-CM | POA: Diagnosis not present

## 2022-04-24 DIAGNOSIS — Z0001 Encounter for general adult medical examination with abnormal findings: Secondary | ICD-10-CM | POA: Diagnosis not present

## 2022-04-24 DIAGNOSIS — I10 Essential (primary) hypertension: Secondary | ICD-10-CM | POA: Diagnosis not present

## 2022-04-24 DIAGNOSIS — E291 Testicular hypofunction: Secondary | ICD-10-CM | POA: Diagnosis not present

## 2022-04-24 DIAGNOSIS — E039 Hypothyroidism, unspecified: Secondary | ICD-10-CM | POA: Diagnosis not present

## 2022-04-24 DIAGNOSIS — E663 Overweight: Secondary | ICD-10-CM | POA: Diagnosis not present

## 2022-04-24 DIAGNOSIS — Z1331 Encounter for screening for depression: Secondary | ICD-10-CM | POA: Diagnosis not present

## 2022-05-06 DIAGNOSIS — E291 Testicular hypofunction: Secondary | ICD-10-CM | POA: Diagnosis not present

## 2022-06-10 DIAGNOSIS — E291 Testicular hypofunction: Secondary | ICD-10-CM | POA: Diagnosis not present

## 2022-07-09 DIAGNOSIS — E291 Testicular hypofunction: Secondary | ICD-10-CM | POA: Diagnosis not present

## 2022-08-13 DIAGNOSIS — E291 Testicular hypofunction: Secondary | ICD-10-CM | POA: Diagnosis not present

## 2022-09-06 DIAGNOSIS — E663 Overweight: Secondary | ICD-10-CM | POA: Diagnosis not present

## 2022-09-06 DIAGNOSIS — E291 Testicular hypofunction: Secondary | ICD-10-CM | POA: Diagnosis not present

## 2022-09-06 DIAGNOSIS — I10 Essential (primary) hypertension: Secondary | ICD-10-CM | POA: Diagnosis not present

## 2022-09-06 DIAGNOSIS — Z6827 Body mass index (BMI) 27.0-27.9, adult: Secondary | ICD-10-CM | POA: Diagnosis not present

## 2022-09-06 DIAGNOSIS — E119 Type 2 diabetes mellitus without complications: Secondary | ICD-10-CM | POA: Diagnosis not present

## 2022-09-13 DIAGNOSIS — E291 Testicular hypofunction: Secondary | ICD-10-CM | POA: Diagnosis not present

## 2022-10-18 DIAGNOSIS — E291 Testicular hypofunction: Secondary | ICD-10-CM | POA: Diagnosis not present

## 2022-11-19 DIAGNOSIS — I10 Essential (primary) hypertension: Secondary | ICD-10-CM | POA: Diagnosis not present

## 2022-11-19 DIAGNOSIS — E291 Testicular hypofunction: Secondary | ICD-10-CM | POA: Diagnosis not present

## 2022-12-20 DIAGNOSIS — E291 Testicular hypofunction: Secondary | ICD-10-CM | POA: Diagnosis not present

## 2023-01-20 DIAGNOSIS — E291 Testicular hypofunction: Secondary | ICD-10-CM | POA: Diagnosis not present

## 2023-02-20 DIAGNOSIS — E291 Testicular hypofunction: Secondary | ICD-10-CM | POA: Diagnosis not present

## 2023-03-25 DIAGNOSIS — E291 Testicular hypofunction: Secondary | ICD-10-CM | POA: Diagnosis not present

## 2023-04-30 DIAGNOSIS — I10 Essential (primary) hypertension: Secondary | ICD-10-CM | POA: Diagnosis not present

## 2023-04-30 DIAGNOSIS — E039 Hypothyroidism, unspecified: Secondary | ICD-10-CM | POA: Diagnosis not present

## 2023-04-30 DIAGNOSIS — E291 Testicular hypofunction: Secondary | ICD-10-CM | POA: Diagnosis not present

## 2023-04-30 DIAGNOSIS — E663 Overweight: Secondary | ICD-10-CM | POA: Diagnosis not present

## 2023-04-30 DIAGNOSIS — E119 Type 2 diabetes mellitus without complications: Secondary | ICD-10-CM | POA: Diagnosis not present

## 2023-04-30 DIAGNOSIS — Z6827 Body mass index (BMI) 27.0-27.9, adult: Secondary | ICD-10-CM | POA: Diagnosis not present

## 2023-04-30 DIAGNOSIS — R011 Cardiac murmur, unspecified: Secondary | ICD-10-CM | POA: Diagnosis not present

## 2023-04-30 DIAGNOSIS — Z1331 Encounter for screening for depression: Secondary | ICD-10-CM | POA: Diagnosis not present

## 2023-04-30 DIAGNOSIS — Z0001 Encounter for general adult medical examination with abnormal findings: Secondary | ICD-10-CM | POA: Diagnosis not present

## 2023-06-02 DIAGNOSIS — E291 Testicular hypofunction: Secondary | ICD-10-CM | POA: Diagnosis not present

## 2023-07-03 DIAGNOSIS — E291 Testicular hypofunction: Secondary | ICD-10-CM | POA: Diagnosis not present

## 2023-07-28 DIAGNOSIS — E039 Hypothyroidism, unspecified: Secondary | ICD-10-CM | POA: Diagnosis not present

## 2023-08-04 DIAGNOSIS — E291 Testicular hypofunction: Secondary | ICD-10-CM | POA: Diagnosis not present

## 2023-09-03 DIAGNOSIS — E291 Testicular hypofunction: Secondary | ICD-10-CM | POA: Diagnosis not present

## 2023-10-06 DIAGNOSIS — E291 Testicular hypofunction: Secondary | ICD-10-CM | POA: Diagnosis not present

## 2023-11-06 DIAGNOSIS — E291 Testicular hypofunction: Secondary | ICD-10-CM | POA: Diagnosis not present

## 2023-12-09 DIAGNOSIS — E291 Testicular hypofunction: Secondary | ICD-10-CM | POA: Diagnosis not present

## 2024-01-07 DIAGNOSIS — E291 Testicular hypofunction: Secondary | ICD-10-CM | POA: Diagnosis not present

## 2024-02-10 DIAGNOSIS — E291 Testicular hypofunction: Secondary | ICD-10-CM | POA: Diagnosis not present

## 2024-05-06 DIAGNOSIS — R799 Abnormal finding of blood chemistry, unspecified: Secondary | ICD-10-CM | POA: Diagnosis not present

## 2024-05-06 DIAGNOSIS — E782 Mixed hyperlipidemia: Secondary | ICD-10-CM | POA: Diagnosis not present

## 2024-05-06 DIAGNOSIS — M1A00X0 Idiopathic chronic gout, unspecified site, without tophus (tophi): Secondary | ICD-10-CM | POA: Diagnosis not present

## 2024-05-06 DIAGNOSIS — I10 Essential (primary) hypertension: Secondary | ICD-10-CM | POA: Diagnosis not present

## 2024-05-06 DIAGNOSIS — E039 Hypothyroidism, unspecified: Secondary | ICD-10-CM | POA: Diagnosis not present

## 2024-05-20 DIAGNOSIS — Z0001 Encounter for general adult medical examination with abnormal findings: Secondary | ICD-10-CM | POA: Diagnosis not present

## 2024-05-20 DIAGNOSIS — E039 Hypothyroidism, unspecified: Secondary | ICD-10-CM | POA: Diagnosis not present

## 2024-05-20 DIAGNOSIS — R7401 Elevation of levels of liver transaminase levels: Secondary | ICD-10-CM | POA: Diagnosis not present

## 2024-05-20 DIAGNOSIS — I10 Essential (primary) hypertension: Secondary | ICD-10-CM | POA: Diagnosis not present

## 2024-06-18 DIAGNOSIS — E039 Hypothyroidism, unspecified: Secondary | ICD-10-CM | POA: Diagnosis not present

## 2024-06-18 DIAGNOSIS — I10 Essential (primary) hypertension: Secondary | ICD-10-CM | POA: Diagnosis not present

## 2024-06-18 DIAGNOSIS — E782 Mixed hyperlipidemia: Secondary | ICD-10-CM | POA: Diagnosis not present
